# Patient Record
Sex: Male | Born: 1935
Health system: Southern US, Community
[De-identification: ages and names within clinical notes are randomized; demographics above are authoritative.]

## PROBLEM LIST (undated history)

## (undated) DIAGNOSIS — I10 Essential (primary) hypertension: Secondary | ICD-10-CM

## (undated) DIAGNOSIS — M1990 Unspecified osteoarthritis, unspecified site: Secondary | ICD-10-CM

## (undated) DIAGNOSIS — M719 Bursopathy, unspecified: Secondary | ICD-10-CM

## (undated) HISTORY — PX: CHOLECYSTECTOMY: SHX55

## (undated) HISTORY — PX: EYE SURGERY: SHX253

---

## 2010-11-09 ENCOUNTER — Emergency Department (INDEPENDENT_AMBULATORY_CARE_PROVIDER_SITE_OTHER): Payer: Medicare Other

## 2010-11-09 ENCOUNTER — Emergency Department (HOSPITAL_BASED_OUTPATIENT_CLINIC_OR_DEPARTMENT_OTHER)
Admission: EM | Admit: 2010-11-09 | Discharge: 2010-11-09 | Disposition: A | Discharge status: Home / Self Care | Payer: Medicare Other | Attending: Emergency Medicine | Admitting: Emergency Medicine

## 2010-11-09 DIAGNOSIS — D649 Anemia, unspecified: Secondary | ICD-10-CM | POA: Insufficient documentation

## 2010-11-09 DIAGNOSIS — R1032 Left lower quadrant pain: Secondary | ICD-10-CM

## 2010-11-09 DIAGNOSIS — I10 Essential (primary) hypertension: Secondary | ICD-10-CM | POA: Insufficient documentation

## 2010-11-09 DIAGNOSIS — R630 Anorexia: Secondary | ICD-10-CM | POA: Insufficient documentation

## 2010-11-09 DIAGNOSIS — E785 Hyperlipidemia, unspecified: Secondary | ICD-10-CM | POA: Insufficient documentation

## 2010-11-09 DIAGNOSIS — K5900 Constipation, unspecified: Secondary | ICD-10-CM | POA: Insufficient documentation

## 2010-11-09 DIAGNOSIS — R10814 Left lower quadrant abdominal tenderness: Secondary | ICD-10-CM | POA: Insufficient documentation

## 2010-11-09 DIAGNOSIS — K5732 Diverticulitis of large intestine without perforation or abscess without bleeding: Secondary | ICD-10-CM | POA: Insufficient documentation

## 2010-11-09 DIAGNOSIS — Z79899 Other long term (current) drug therapy: Secondary | ICD-10-CM | POA: Insufficient documentation

## 2010-11-09 DIAGNOSIS — R000 Tachycardia, unspecified: Secondary | ICD-10-CM | POA: Insufficient documentation

## 2010-11-09 LAB — CBC
HCT: 35.4 % — ABNORMAL LOW (ref 39.0–52.0)
Hemoglobin: 11.9 g/dL — ABNORMAL LOW (ref 13.0–17.0)
MCV: 90.3 fL (ref 78.0–100.0)
RBC: 3.92 MIL/uL — ABNORMAL LOW (ref 4.22–5.81)
WBC: 11.1 10*3/uL — ABNORMAL HIGH (ref 4.0–10.5)

## 2010-11-09 LAB — URINE MICROSCOPIC-ADD ON

## 2010-11-09 LAB — BASIC METABOLIC PANEL
BUN: 18 mg/dL (ref 6–23)
CO2: 24 mEq/L (ref 19–32)
Chloride: 106 mEq/L (ref 96–112)
GFR calc non Af Amer: >60 mL/min (ref >60)
Glucose, Bld: 110 mg/dL — ABNORMAL HIGH (ref 70–99)
Potassium: 4.2 mEq/L (ref 3.5–5.1)
Sodium: 141 mEq/L (ref 135–145)

## 2010-11-09 LAB — DIFFERENTIAL
Basophils Absolute: 0 10*3/uL (ref 0.0–0.1)
Eosinophils Relative: 2 % (ref 0–5)
Lymphocytes Relative: 13 % (ref 12–46)
Lymphs Abs: 1.5 10*3/uL (ref 0.7–4.0)
Neutro Abs: 8 10*3/uL — ABNORMAL HIGH (ref 1.7–7.7)
Neutrophils Relative %: 72 % (ref 43–77)

## 2010-11-09 LAB — URINALYSIS, ROUTINE W REFLEX MICROSCOPIC
Bilirubin Urine: NEGATIVE
Ketones, ur: NEGATIVE mg/dL
Nitrite: NEGATIVE
Specific Gravity, Urine: 1.025 (ref 1.005–1.030)
Urine Glucose, Fasting: NEGATIVE mg/dL
pH: 7 (ref 5.0–8.0)

## 2010-11-09 MED ORDER — IOHEXOL 300 MG/ML  SOLN
100.0000 mL | Freq: Once | INTRAMUSCULAR | Status: AC | PRN
  Administered 2010-11-09: 100 mL via INTRAVENOUS

## 2013-04-02 ENCOUNTER — Emergency Department (HOSPITAL_BASED_OUTPATIENT_CLINIC_OR_DEPARTMENT_OTHER): Payer: Medicare Other

## 2013-04-02 ENCOUNTER — Encounter (HOSPITAL_BASED_OUTPATIENT_CLINIC_OR_DEPARTMENT_OTHER): Payer: Self-pay | Admitting: *Deleted

## 2013-04-02 DIAGNOSIS — E119 Type 2 diabetes mellitus without complications: Secondary | ICD-10-CM | POA: Insufficient documentation

## 2013-04-02 DIAGNOSIS — Y929 Unspecified place or not applicable: Secondary | ICD-10-CM | POA: Insufficient documentation

## 2013-04-02 DIAGNOSIS — Z87891 Personal history of nicotine dependence: Secondary | ICD-10-CM | POA: Insufficient documentation

## 2013-04-02 DIAGNOSIS — Z79899 Other long term (current) drug therapy: Secondary | ICD-10-CM | POA: Insufficient documentation

## 2013-04-02 DIAGNOSIS — S62113A Displaced fracture of triquetrum [cuneiform] bone, unspecified wrist, initial encounter for closed fracture: Secondary | ICD-10-CM | POA: Insufficient documentation

## 2013-04-02 DIAGNOSIS — W219XXA Striking against or struck by unspecified sports equipment, initial encounter: Secondary | ICD-10-CM | POA: Insufficient documentation

## 2013-04-02 DIAGNOSIS — I10 Essential (primary) hypertension: Secondary | ICD-10-CM | POA: Insufficient documentation

## 2013-04-02 DIAGNOSIS — Z7982 Long term (current) use of aspirin: Secondary | ICD-10-CM | POA: Insufficient documentation

## 2013-04-02 DIAGNOSIS — Z8739 Personal history of other diseases of the musculoskeletal system and connective tissue: Secondary | ICD-10-CM | POA: Insufficient documentation

## 2013-04-02 DIAGNOSIS — Y9389 Activity, other specified: Secondary | ICD-10-CM | POA: Insufficient documentation

## 2013-04-02 NOTE — ED Notes (Signed)
Pt states he injured his left wrist playing racquetball earlier today. PMS intact. Ice and splint applied.

## 2013-04-03 ENCOUNTER — Emergency Department (HOSPITAL_BASED_OUTPATIENT_CLINIC_OR_DEPARTMENT_OTHER)
Admission: EM | Admit: 2013-04-03 | Discharge: 2013-04-03 | Disposition: A | Discharge status: Home / Self Care | Payer: Medicare Other | Attending: Emergency Medicine | Admitting: Emergency Medicine

## 2013-04-03 DIAGNOSIS — S62112A Displaced fracture of triquetrum [cuneiform] bone, left wrist, initial encounter for closed fracture: Secondary | ICD-10-CM

## 2013-04-03 HISTORY — DX: Unspecified osteoarthritis, unspecified site: M19.90

## 2013-04-03 HISTORY — DX: Essential (primary) hypertension: I10

## 2013-04-03 HISTORY — DX: Bursopathy, unspecified: M71.9

## 2013-04-03 NOTE — ED Provider Notes (Signed)
History     This chart was scribed for Timothy Seamen, MD by Jiles Prows, ED Scribe. The patient was seen in room MH05/MH05 and the patient's care was started at 12:46 AM.   CSN: [id number] Arrival date & time 04/02/13  2232    Chief Complaint  Patient presents with  . Wrist Injury    The history is provided by the patient and medical records. No language interpreter was used.   HPI Comments: Timothy Maynard is a 79 y.o. male who presents to the Emergency Department complaining of moderate pain in the left anterior forearm.  Pt reports he joined the [employer] a week ago.  He states that he was playing racketball tonight when he hit his left arm on the wall.  He claims exacerbated pain with pressure or movement.  He reports decreased ability to move fingers of left hand.  Pt with dominant right hand.  Pt denies headache, diaphoresis, fever, chills, nausea, vomiting, diarrhea, weakness, cough, SOB and any other pain.   Past Medical History  Diagnosis Date  . Hypertension   . Diabetes mellitus without complication   . Bursitis   . Arthritis    Past Surgical History  Procedure Laterality Date  . Eye surgery    . Cholecystectomy     History reviewed. No pertinent family history. History  Substance Use Topics  . Smoking status: Former Games developer  . Smokeless tobacco: Not on file  . Alcohol Use: No    Review of Systems  All other systems reviewed and are negative.    Allergies  Review of patient's allergies indicates no known allergies.  Home Medications   Current Outpatient Rx  Name  Route  Sig  Dispense  Refill  . aspirin 81 MG tablet   Oral   Take 81 mg by mouth daily.         Marland Kitchen diltiazem (DILACOR XR) 240 MG 24 hr capsule   Oral   Take 240 mg by mouth daily.         . hydrochlorothiazide (HYDRODIURIL) 25 MG tablet   Oral   Take 12.5 mg by mouth daily.         Marland Kitchen losartan (COZAAR) 100 MG tablet   Oral   Take 100 mg by mouth daily.         . Multiple  Vitamins-Minerals (ICAPS LUTEIN & OMEGA-3) CAPS   Oral   Take by mouth.         . polycarbophil (FIBERCON) 625 MG tablet   Oral   Take 625 mg by mouth daily.         . pravastatin (PRAVACHOL) 40 MG tablet   Oral   Take 40 mg by mouth daily.          BP 152/76  Pulse 87  Temp(Src) 97.7 F (36.5 C) (Oral)  SpO2 96% Physical Exam  Nursing note and vitals reviewed. Constitutional: He is oriented to person, place, and time. He appears well-developed and well-nourished. No distress.  HENT:  Head: Normocephalic and atraumatic.  Right Ear: External ear normal.  Left Ear: External ear normal.  Eyes: Conjunctivae and EOM are normal. Pupils are equal, round, and reactive to light.  Arcus similis bilaterally.  Neck: Normal range of motion. Neck supple. No tracheal deviation present.  Cardiovascular: Normal rate, regular rhythm and normal heart sounds.  Exam reveals no gallop and no friction rub.   No murmur heard. Pulmonary/Chest: Effort normal and breath sounds normal. No respiratory distress. He  has no wheezes. He has no rales. He exhibits no tenderness.  Abdominal: Soft. He exhibits no distension. There is no tenderness. There is no rebound and no guarding.  Musculoskeletal: Normal range of motion. He exhibits tenderness.  Tenderness of volar aspect of left wrist over carpal bones.  Decreased ROM of left wrist particularly extension.  Left hand distally neurovascularly intact with tendon function.  Neurological: He is alert and oriented to person, place, and time.  Skin: Skin is warm and dry.  Psychiatric: He has a normal mood and affect. His behavior is normal.   ED Course  Procedures (including critical care time) DIAGNOSTIC STUDIES: Oxygen Saturation is 96% on RA, adequate by my interpretation.    COORDINATION OF CARE: 12:49 AM - Discussed ED treatment with pt at bedside including pain management and follow up with orthopedist and pt agrees.   Labs Reviewed - No data to  display Dg Wrist Complete Left  04/03/2013   *RADIOLOGY REPORT*  Clinical Data: Left wrist injury playing racquetball.  Increased pain with movement.  LEFT WRIST - COMPLETE 3+ VIEW  Comparison: None.  Findings: Degenerative changes in the STT joints.  Circumscribed lucent structure in the distal scaphoid likely represents a degenerative cyst or benign bone cyst.  Focal cortical cleft demonstrated over the dorsal aspect of the triquetral bone with overlying soft tissue swelling.  Nondisplaced triquetral fracture is not excluded.  Correlation with site of patient's pain is recommended.  Carpal bones appear otherwise intact. No radiopaque soft tissue foreign bodies.  IMPRESSION: Focal cleft in the dorsal aspect the triquetral bone with overlying soft tissue swelling could represent nondisplaced fracture. Degenerative changes in the STT joints with probable degenerative cyst in the scaphoid.   Original Report Authenticated By: Burman Nieves, M.D.   1. Fracture of triquetrum of left wrist, closed, initial encounter     MDM  I personally performed the services described in this documentation, which was scribed in my presence.  The recorded information has been reviewed and is accurate.    Timothy Seamen, MD 04/03/13 [phone]

## 2013-05-15 ENCOUNTER — Encounter (HOSPITAL_BASED_OUTPATIENT_CLINIC_OR_DEPARTMENT_OTHER): Payer: Self-pay | Admitting: *Deleted

## 2013-05-15 ENCOUNTER — Emergency Department (HOSPITAL_BASED_OUTPATIENT_CLINIC_OR_DEPARTMENT_OTHER)
Admission: EM | Admit: 2013-05-15 | Discharge: 2013-05-15 | Disposition: A | Discharge status: Home / Self Care | Payer: Medicare Other | Attending: Emergency Medicine | Admitting: Emergency Medicine

## 2013-05-15 ENCOUNTER — Emergency Department (HOSPITAL_BASED_OUTPATIENT_CLINIC_OR_DEPARTMENT_OTHER): Payer: Medicare Other

## 2013-05-15 DIAGNOSIS — Z8739 Personal history of other diseases of the musculoskeletal system and connective tissue: Secondary | ICD-10-CM | POA: Insufficient documentation

## 2013-05-15 DIAGNOSIS — Z87891 Personal history of nicotine dependence: Secondary | ICD-10-CM | POA: Insufficient documentation

## 2013-05-15 DIAGNOSIS — Z79899 Other long term (current) drug therapy: Secondary | ICD-10-CM | POA: Insufficient documentation

## 2013-05-15 DIAGNOSIS — Z7982 Long term (current) use of aspirin: Secondary | ICD-10-CM | POA: Insufficient documentation

## 2013-05-15 DIAGNOSIS — R0602 Shortness of breath: Secondary | ICD-10-CM | POA: Insufficient documentation

## 2013-05-15 DIAGNOSIS — I10 Essential (primary) hypertension: Secondary | ICD-10-CM | POA: Insufficient documentation

## 2013-05-15 DIAGNOSIS — M129 Arthropathy, unspecified: Secondary | ICD-10-CM | POA: Insufficient documentation

## 2013-05-15 DIAGNOSIS — R059 Cough, unspecified: Secondary | ICD-10-CM | POA: Insufficient documentation

## 2013-05-15 DIAGNOSIS — R05 Cough: Secondary | ICD-10-CM | POA: Insufficient documentation

## 2013-05-15 DIAGNOSIS — E119 Type 2 diabetes mellitus without complications: Secondary | ICD-10-CM | POA: Insufficient documentation

## 2013-05-15 DIAGNOSIS — J690 Pneumonitis due to inhalation of food and vomit: Secondary | ICD-10-CM

## 2013-05-15 DIAGNOSIS — R062 Wheezing: Secondary | ICD-10-CM | POA: Insufficient documentation

## 2013-05-15 DIAGNOSIS — J069 Acute upper respiratory infection, unspecified: Secondary | ICD-10-CM | POA: Insufficient documentation

## 2013-05-15 DIAGNOSIS — R0989 Other specified symptoms and signs involving the circulatory and respiratory systems: Secondary | ICD-10-CM | POA: Insufficient documentation

## 2013-05-15 MED ORDER — ALBUTEROL SULFATE HFA 108 (90 BASE) MCG/ACT IN AERS
2.0000 | INHALATION_SPRAY | Freq: Once | RESPIRATORY_TRACT | Status: AC
  Administered 2013-05-15: 2 via RESPIRATORY_TRACT

## 2013-05-15 MED ORDER — ALBUTEROL SULFATE (5 MG/ML) 0.5% IN NEBU
INHALATION_SOLUTION | RESPIRATORY_TRACT | Status: AC
  Filled 2013-05-15: qty 1

## 2013-05-15 MED ORDER — ALBUTEROL SULFATE (5 MG/ML) 0.5% IN NEBU
5.0000 mg | INHALATION_SOLUTION | Freq: Once | RESPIRATORY_TRACT | Status: AC
  Administered 2013-05-15: 5 mg via RESPIRATORY_TRACT

## 2013-05-15 MED ORDER — IPRATROPIUM BROMIDE 0.02 % IN SOLN
RESPIRATORY_TRACT | Status: AC
  Filled 2013-05-15: qty 2.5

## 2013-05-15 MED ORDER — ALBUTEROL SULFATE (5 MG/ML) 0.5% IN NEBU: 2.5000 mg | INHALATION_SOLUTION | RESPIRATORY_TRACT | Status: DC

## 2013-05-15 MED ORDER — LEVOFLOXACIN 750 MG PO TABS: 750.0000 mg | ORAL_TABLET | Freq: Once | ORAL | Status: DC

## 2013-05-15 MED ORDER — IPRATROPIUM BROMIDE 0.02 % IN SOLN
0.5000 mg | Freq: Once | RESPIRATORY_TRACT | Status: AC
  Administered 2013-05-15: 0.5 mg via RESPIRATORY_TRACT

## 2013-05-15 MED ORDER — IPRATROPIUM BROMIDE 0.02 % IN SOLN: 0.5000 mg | RESPIRATORY_TRACT | Status: DC

## 2013-05-15 MED ORDER — CLINDAMYCIN HCL 150 MG PO CAPS
450.0000 mg | ORAL_CAPSULE | Freq: Once | ORAL | Status: AC
  Administered 2013-05-15: 450 mg via ORAL
  Filled 2013-05-15: qty 3

## 2013-05-15 MED ORDER — ALBUTEROL SULFATE HFA 108 (90 BASE) MCG/ACT IN AERS
INHALATION_SPRAY | RESPIRATORY_TRACT | Status: AC
  Filled 2013-05-15: qty 6.7

## 2013-05-15 MED ORDER — LEVOFLOXACIN 750 MG PO TABS
750.0000 mg | ORAL_TABLET | Freq: Once | ORAL | Status: AC
  Administered 2013-05-15: 750 mg via ORAL
  Filled 2013-05-15: qty 1

## 2013-05-15 MED ORDER — CLINDAMYCIN HCL 150 MG PO CAPS: 450.0000 mg | ORAL_CAPSULE | Freq: Three times a day (TID) | ORAL | Status: DC

## 2013-05-15 NOTE — ED Notes (Signed)
Patient woke up last night vomiting. patient thinks that he aspirated when he vomited.  Patient thinks that is the reason why he has been coughing.  Patient denies any nausea or vomiting.

## 2013-05-15 NOTE — ED Notes (Signed)
Pt woke up and vomited a small amount and things he "inhaled", coughing intermittently since, states feels it in chest, was also power washing yesterday and sprayed bug spray

## 2013-05-15 NOTE — ED Provider Notes (Signed)
CSN: [id number]     Arrival date & time 05/15/13  1318 History     First MD Initiated Contact with Patient 05/15/13 1331     No chief complaint on file.  (Consider location/radiation/quality/duration/timing/severity/associated sxs/prior Treatment) Patient is a 79 y.o. male presenting with vomiting. The history is provided by the patient.  Emesis Severity:  Mild Timing: once. Number of daily episodes:  One Able to tolerate:  Liquids Progression:  Resolved Chronicity:  New Recent urination:  Normal Relieved by:  Nothing Worsened by:  Nothing tried Ineffective treatments:  None tried Associated symptoms: cough (mild) and URI (recently treated for chest congestion with azithromycin)   Associated symptoms: no abdominal pain, no chills, no diarrhea, no fever, no headaches, no myalgias and no sore throat   Risk factors comment:  Used bug spray yesterday, concerned he might've inhaled a small amount   Past Medical History  Diagnosis Date  . Hypertension   . Diabetes mellitus without complication   . Bursitis   . Arthritis    Past Surgical History  Procedure Laterality Date  . Eye surgery    . Cholecystectomy     History reviewed. No pertinent family history. History  Substance Use Topics  . Smoking status: Former Games developer  . Smokeless tobacco: Not on file  . Alcohol Use: No    Review of Systems  Constitutional: Negative for fever and chills.  HENT: Negative for sore throat.   Respiratory: Positive for cough and shortness of breath.   Gastrointestinal: Positive for vomiting (once, overnight). Negative for abdominal pain and diarrhea.  Musculoskeletal: Negative for myalgias.  Neurological: Negative for headaches.  All other systems reviewed and are negative.    Allergies  Review of patient's allergies indicates no known allergies.  Home Medications   Current Outpatient Rx  Name  Route  Sig  Dispense  Refill  . aspirin 81 MG tablet   Oral   Take 81 mg by mouth  daily.         Marland Kitchen diltiazem (DILACOR XR) 240 MG 24 hr capsule   Oral   Take 240 mg by mouth daily.         . hydrochlorothiazide (HYDRODIURIL) 25 MG tablet   Oral   Take 12.5 mg by mouth daily.         Marland Kitchen losartan (COZAAR) 100 MG tablet   Oral   Take 100 mg by mouth daily.         . Multiple Vitamins-Minerals (ICAPS LUTEIN & OMEGA-3) CAPS   Oral   Take by mouth.         . polycarbophil (FIBERCON) 625 MG tablet   Oral   Take 625 mg by mouth daily.         . pravastatin (PRAVACHOL) 40 MG tablet   Oral   Take 40 mg by mouth daily.          BP 129/58  Pulse 95  Temp(Src) 97.6 F (36.4 C) (Oral)  Resp 20  Ht 5\' 9"  (1.753 m)  Wt 200 lb (90.719 kg)  BMI 29.52 kg/m2  SpO2 94% Physical Exam  Constitutional: He is oriented to person, place, and time. He appears well-developed and well-nourished. No distress.  HENT:  Head: Normocephalic and atraumatic.  Mouth/Throat: No oropharyngeal exudate.  Eyes: EOM are normal. Pupils are equal, round, and reactive to light.  Neck: Normal range of motion. Neck supple.  Cardiovascular: Normal rate and regular rhythm.  Exam reveals no friction rub.   No  murmur heard. Pulmonary/Chest: Effort normal. No respiratory distress. He has wheezes (end expiratory, diffuse). He has no rales.  Abdominal: He exhibits no distension. There is no tenderness. There is no rebound.  Musculoskeletal: Normal range of motion. He exhibits no edema.  Neurological: He is alert and oriented to person, place, and time.  Skin: He is not diaphoretic.    ED Course   Procedures (including critical care time)  Labs Reviewed - No data to display Dg Chest 2 View  05/15/2013   *RADIOLOGY REPORT*  Clinical Data: Shortness of breath, vomiting last night question aspiration, history hypertension, diabetes  CHEST - 2 VIEW  Comparison: None  Findings: Normal heart size and pulmonary vascularity. Tortuous aorta. Right middle lobe infiltrate which could represent  pneumonia or aspiration. Minimal left basilar atelectasis. Remaining lungs clear. No pleural effusion or pneumothorax. Bones unremarkable.  IMPRESSION: Right middle lobe infiltrate question pneumonia versus aspiration. Minimal left base atelectasis.   Original Report Authenticated By: Ulyses Southward, M.D.   1. Aspiration pneumonia     MDM  79 year old male comes in with shortness of breath and coughing. Patient recently finished a course of azithromycin for some chest congestion about 5-6 days ago. He reports that overnight he vomited and feels like he inhaled some into the lungs used and coughing intermittently since then. One episode of productive cough of with yellow sputum, all other coughing has been with clear sputum. Patient also concerned because he was bug spraying yesterday and he might inhaled he was spraying in house. He denies any fevers, vomiting after that first episode, abdominal pain, shortness of breath at this time, hemoptysis. He denies any chills or other systemic symptoms. On exam his vitals are stable. No hypoxia. No tachypnea or respiratory distress. He has some mild expiratory wheezes in all lung fields. Will give duo neb. Will obtain chest x-ray. Chest x-ray shows a right middle lobe pneumonia. Also concern for possible aspiration. Will treat with Levaquin and Clindamycin. I do not feel labs are warranted with stable vitals and good appearance, patient does not want admission. Patient would like to try outpatient therapy and is amenable to this plan. Encouraged very quick and prompt PCP followup. He states he can see his doctor Monday or Tuesday. Given strict return precautions.   Dagmar Hait, MD 05/15/13 1440

## 2013-12-19 ENCOUNTER — Emergency Department (HOSPITAL_BASED_OUTPATIENT_CLINIC_OR_DEPARTMENT_OTHER): Payer: Medicare Other

## 2013-12-19 ENCOUNTER — Emergency Department (HOSPITAL_BASED_OUTPATIENT_CLINIC_OR_DEPARTMENT_OTHER)
Admission: EM | Admit: 2013-12-19 | Discharge: 2013-12-19 | Disposition: A | Discharge status: Home / Self Care | Payer: Medicare Other | Attending: Emergency Medicine | Admitting: Emergency Medicine

## 2013-12-19 ENCOUNTER — Encounter (HOSPITAL_BASED_OUTPATIENT_CLINIC_OR_DEPARTMENT_OTHER): Payer: Self-pay | Admitting: Emergency Medicine

## 2013-12-19 DIAGNOSIS — Z79899 Other long term (current) drug therapy: Secondary | ICD-10-CM | POA: Insufficient documentation

## 2013-12-19 DIAGNOSIS — R0602 Shortness of breath: Secondary | ICD-10-CM

## 2013-12-19 DIAGNOSIS — R05 Cough: Secondary | ICD-10-CM | POA: Insufficient documentation

## 2013-12-19 DIAGNOSIS — I10 Essential (primary) hypertension: Secondary | ICD-10-CM | POA: Insufficient documentation

## 2013-12-19 DIAGNOSIS — Z7982 Long term (current) use of aspirin: Secondary | ICD-10-CM | POA: Insufficient documentation

## 2013-12-19 DIAGNOSIS — Z87891 Personal history of nicotine dependence: Secondary | ICD-10-CM | POA: Insufficient documentation

## 2013-12-19 DIAGNOSIS — Z8739 Personal history of other diseases of the musculoskeletal system and connective tissue: Secondary | ICD-10-CM | POA: Insufficient documentation

## 2013-12-19 DIAGNOSIS — M129 Arthropathy, unspecified: Secondary | ICD-10-CM | POA: Insufficient documentation

## 2013-12-19 DIAGNOSIS — R059 Cough, unspecified: Secondary | ICD-10-CM | POA: Insufficient documentation

## 2013-12-19 LAB — COMPREHENSIVE METABOLIC PANEL
ALK PHOS: 108 U/L (ref 39–117)
ALT: 40 U/L (ref 0–53)
AST: 28 U/L (ref 0–37)
Albumin: 3.6 g/dL (ref 3.5–5.2)
BILIRUBIN TOTAL: 0.3 mg/dL (ref 0.3–1.2)
BUN: 21 mg/dL (ref 6–23)
CHLORIDE: 106 meq/L (ref 96–112)
CO2: 26 meq/L (ref 19–32)
CREATININE: 1 mg/dL (ref 0.50–1.35)
Calcium: 9.5 mg/dL (ref 8.4–10.5)
GFR calc Af Amer: 82 mL/min — ABNORMAL LOW (ref >90)
GFR, EST NON AFRICAN AMERICAN: 70 mL/min — AB (ref >90)
Glucose, Bld: 101 mg/dL — ABNORMAL HIGH (ref 70–99)
Potassium: 4.3 mEq/L (ref 3.7–5.3)
Sodium: 144 mEq/L (ref 137–147)
Total Protein: 6.8 g/dL (ref 6.0–8.3)

## 2013-12-19 LAB — CBC WITH DIFFERENTIAL/PLATELET
BASOS ABS: 0.1 10*3/uL (ref 0.0–0.1)
BASOS PCT: 1 % (ref 0–1)
EOS ABS: 0.2 10*3/uL (ref 0.0–0.7)
Eosinophils Relative: 4 % (ref 0–5)
HCT: 37.2 % — ABNORMAL LOW (ref 39.0–52.0)
Hemoglobin: 12.6 g/dL — ABNORMAL LOW (ref 13.0–17.0)
Lymphocytes Relative: 29 % (ref 12–46)
Lymphs Abs: 1.2 10*3/uL (ref 0.7–4.0)
MCH: 31.8 pg (ref 26.0–34.0)
MCHC: 33.9 g/dL (ref 30.0–36.0)
MCV: 93.9 fL (ref 78.0–100.0)
Monocytes Absolute: 0.5 10*3/uL (ref 0.1–1.0)
Monocytes Relative: 12 % (ref 3–12)
NEUTROS PCT: 55 % (ref 43–77)
Neutro Abs: 2.4 10*3/uL (ref 1.7–7.7)
PLATELETS: 160 10*3/uL (ref 150–400)
RBC: 3.96 MIL/uL — AB (ref 4.22–5.81)
RDW: 14.4 % (ref 11.5–15.5)
WBC: 4.3 10*3/uL (ref 4.0–10.5)

## 2013-12-19 LAB — PRO B NATRIURETIC PEPTIDE: PRO B NATRI PEPTIDE: 115.7 pg/mL (ref 0–450)

## 2013-12-19 LAB — TROPONIN I

## 2013-12-19 NOTE — ED Provider Notes (Addendum)
CSN: [id number]     Arrival date & time 12/19/13  0902 History   First MD Initiated Contact with Patient 12/19/13 [phone]     Chief Complaint  Patient presents with  . Shortness of Breath     (Consider location/radiation/quality/duration/timing/severity/associated sxs/prior Treatment) The history is provided by the patient.   80 year old male with known history of shortness of breath for the past several months. No specific diagnosis. Was a former smoker so COPD or emphysema is a possibility. Patient denies any chest pain. Last 2 weeks some exertional shortness of breath has gotten a little bit worse. However patient has been able to recently loaded pickup truck full of a Cotton[place] after treatment went down without any significant problems. Patient denies any chest pain any back pain no real congestion no significant cough. Patient is followed by internal medicine or stone.  Past Medical History  Diagnosis Date  . Hypertension   . Bursitis   . Arthritis    Past Surgical History  Procedure Laterality Date  . Eye surgery    . Cholecystectomy     History reviewed. No pertinent family history. History  Substance Use Topics  . Smoking status: Former Research scientist (life sciences)  . Smokeless tobacco: Not on file  . Alcohol Use: No    Review of Systems  Constitutional: Negative for fever and fatigue.  HENT: Negative for congestion.   Respiratory: Positive for cough and shortness of breath.   Cardiovascular: Negative for chest pain.  Gastrointestinal: Negative for nausea, vomiting and abdominal pain.  Genitourinary: Negative for dysuria.  Musculoskeletal: Negative for back pain.  Skin: Negative for rash.  Neurological: Negative for headaches.  Hematological: Does not bruise/bleed easily.  Psychiatric/Behavioral: Negative for confusion.      Allergies  Ciprofloxacin; Cyclobenzaprine; and Levaquin  Home Medications   Current Outpatient Rx  Name  Route  Sig  Dispense  Refill  . aspirin 81 MG  tablet   Oral   Take 81 mg by mouth daily.         Marland Kitchen diltiazem (DILACOR XR) 240 MG 24 hr capsule   Oral   Take 240 mg by mouth daily.         . hydrochlorothiazide (HYDRODIURIL) 25 MG tablet   Oral   Take 12.5 mg by mouth daily.         Marland Kitchen losartan (COZAAR) 100 MG tablet   Oral   Take 100 mg by mouth daily.         . Multiple Vitamins-Minerals (ICAPS LUTEIN & OMEGA-3) CAPS   Oral   Take by mouth.         . polycarbophil (FIBERCON) 625 MG tablet   Oral   Take 625 mg by mouth daily.         . pravastatin (PRAVACHOL) 40 MG tablet   Oral   Take 40 mg by mouth daily.          BP 122/71  Pulse 86  Temp(Src) 98.1 F (36.7 C) (Oral)  Resp 16  Ht 5\' 8"  (1.727 m)  Wt 200 lb (90.719 kg)  BMI 30.42 kg/m2  SpO2 98% Physical Exam  Nursing note and vitals reviewed. Constitutional: He is oriented to person, place, and time. He appears well-developed and well-nourished. No distress.  HENT:  Head: Normocephalic and atraumatic.  Mouth/Throat: Oropharynx is clear and moist.  Eyes: Conjunctivae and EOM are normal. Pupils are equal, round, and reactive to light.  Neck: Normal range of motion.  Cardiovascular: Normal rate, regular rhythm  and normal heart sounds.   No murmur heard. Pulmonary/Chest: Effort normal and breath sounds normal. No respiratory distress.  Abdominal: Soft. Bowel sounds are normal. There is no tenderness.  Musculoskeletal: Normal range of motion. He exhibits no edema.  Neurological: He is alert and oriented to person, place, and time. No cranial nerve deficit. He exhibits normal muscle tone. Coordination normal.  Skin: Skin is warm. No rash noted.    ED Course  Procedures (including critical care time) Labs Review Labs Reviewed  COMPREHENSIVE METABOLIC PANEL - Abnormal; Notable for the following:    Glucose, Bld 101 (*)    GFR calc non Af Amer 70 (*)    GFR calc Af Amer 82 (*)    All other components within normal limits  CBC WITH  DIFFERENTIAL - Abnormal; Notable for the following:    RBC 3.96 (*)    Hemoglobin 12.6 (*)    HCT 37.2 (*)    All other components within normal limits  TROPONIN I  PRO B NATRIURETIC PEPTIDE   Results for orders placed during the hospital encounter of 12/19/13  TROPONIN I      Result Value Ref Range   Troponin I <0.30  <0.30 ng/mL  COMPREHENSIVE METABOLIC PANEL      Result Value Ref Range   Sodium 144  137 - 147 mEq/L   Potassium 4.3  3.7 - 5.3 mEq/L   Chloride 106  96 - 112 mEq/L   CO2 26  19 - 32 mEq/L   Glucose, Bld 101 (*) 70 - 99 mg/dL   BUN 21  6 - 23 mg/dL   Creatinine, Ser 1.00  0.50 - 1.35 mg/dL   Calcium 9.5  8.4 - 10.5 mg/dL   Total Protein 6.8  6.0 - 8.3 g/dL   Albumin 3.6  3.5 - 5.2 g/dL   AST 28  0 - 37 U/L   ALT 40  0 - 53 U/L   Alkaline Phosphatase 108  39 - 117 U/L   Total Bilirubin 0.3  0.3 - 1.2 mg/dL   GFR calc non Af Amer 70 (*) >90 mL/min   GFR calc Af Amer 82 (*) >90 mL/min  CBC WITH DIFFERENTIAL      Result Value Ref Range   WBC 4.3  4.0 - 10.5 K/uL   RBC 3.96 (*) 4.22 - 5.81 MIL/uL   Hemoglobin 12.6 (*) 13.0 - 17.0 g/dL   HCT 37.2 (*) 39.0 - 52.0 %   MCV 93.9  78.0 - 100.0 fL   MCH 31.8  26.0 - 34.0 pg   MCHC 33.9  30.0 - 36.0 g/dL   RDW 14.4  11.5 - 15.5 %   Platelets 160  150 - 400 K/uL   Neutrophils Relative % 55  43 - 77 %   Neutro Abs 2.4  1.7 - 7.7 K/uL   Lymphocytes Relative 29  12 - 46 %   Lymphs Abs 1.2  0.7 - 4.0 K/uL   Monocytes Relative 12  3 - 12 %   Monocytes Absolute 0.5  0.1 - 1.0 K/uL   Eosinophils Relative 4  0 - 5 %   Eosinophils Absolute 0.2  0.0 - 0.7 K/uL   Basophils Relative 1  0 - 1 %   Basophils Absolute 0.1  0.0 - 0.1 K/uL  PRO B NATRIURETIC PEPTIDE      Result Value Ref Range   Pro B Natriuretic peptide (BNP) 115.7  0 - 450 pg/mL    Imaging  Review Dg Chest 2 View  12/19/2013   CLINICAL DATA:  Shortness of breath.  EXAM: CHEST  2 VIEW  COMPARISON:  PA and lateral chest 10/31/2013 and 06/06/2013.  FINDINGS:  The lungs are clear. Heart size is normal. There is no pneumothorax or pleural effusion. No focal bony abnormality is identified.  IMPRESSION: Negative exam.   Electronically Signed   By: Inge Rise M.D.   On: 12/19/2013 10:17     EKG Interpretation   Date/Time:  Monday December 19 2013 09:15:53 EDT Ventricular Rate:  94 PR Interval:  166 QRS Duration: 102 QT Interval:  356 QTC Calculation: 445 R Axis:   48 Text Interpretation:  Normal sinus rhythm Incomplete right bundle branch  block Borderline ECG No previous ECGs available Confirmed by Dymphna Wadley   MD, Delailah Spieth [phone]) on 12/19/2013 10:36:35 AM      Date: 12/19/2013  Rate: 94  Rhythm: normal sinus rhythm  QRS Axis: normal  Intervals: normal  ST/T Wave abnormalities: normal  Conduction Disutrbances:right bundle branch block  Narrative Interpretation:   Old EKG Reviewed: none available  And consistent with an incomplete right bundle branch block. EKG did not crossover in MUSE.  MDM   Final diagnoses:  Shortness of breath   Patient with long-standing exertional shortness of breath just a little bit worse in the last week or so. Not associated with any chest pain. Today's workup negative EKG without acute changes. Troponin negative no evidence of an acute cardiac event. No significant elevation in BNP. Chest x-ray negative for pneumonia pneumothorax or pulmonary edema. No significant anemia noted significant electrolyte abnormalities renal function is normal. Patient's symptoms been ongoing for a good period of time clinically not concerned about pulmonary embolus. Patient does have primary care Dr. to followup with.    Mervin Kung, MD 12/19/13 [place], MD 12/19/13 [phone]

## 2013-12-19 NOTE — Discharge Instructions (Signed)
Shortness of Breath Shortness of breath means you have trouble breathing. Shortness of breath needs medical care right away. HOME CARE   Do not smoke.  Avoid being around chemicals or things (paint fumes, dust) that may bother your breathing.  Rest as needed. Slowly begin your normal activities.  Only take medicines as told by your doctor.  Keep all doctor visits as told. GET HELP RIGHT AWAY IF:   Your shortness of breath gets worse.  You feel lightheaded, pass out (faint), or have a cough that is not helped by medicine.  You cough up blood.  You have pain with breathing.  You have pain in your chest, arms, shoulders, or belly (abdomen).  You have a fever.  You cannot walk up stairs or exercise the way you normally do.  You do not get better in the time expected.  You have a hard time doing normal activities even with rest.  You have problems with your medicines.  You have any new symptoms. MAKE SURE YOU:  Understand these instructions.  Will watch your condition.  Will get help right away if you are not doing well or get worse. Document Released: 02/25/2008 Document Revised: 03/09/2012 Document Reviewed: 11/24/2011 [facility] Patient Information 2014 [place], [place].  Return for any newer worse symptoms. Otherwise followup with your regular Dr. sometime in the next few weeks. Today's workup without any significant findings.

## 2013-12-19 NOTE — ED Notes (Signed)
Pt reports SOB x several months.  Pt to leave to go out of town today and wanted to be checked out prior to leaving.  Denies pain, reports worsening SOB with exertion.

## 2014-07-23 ENCOUNTER — Encounter (HOSPITAL_BASED_OUTPATIENT_CLINIC_OR_DEPARTMENT_OTHER): Payer: Self-pay | Admitting: Emergency Medicine

## 2014-07-23 ENCOUNTER — Emergency Department (HOSPITAL_BASED_OUTPATIENT_CLINIC_OR_DEPARTMENT_OTHER)
Admission: EM | Admit: 2014-07-23 | Discharge: 2014-07-23 | Disposition: A | Discharge status: Home / Self Care | Payer: Medicare Other | Attending: Emergency Medicine | Admitting: Emergency Medicine

## 2014-07-23 ENCOUNTER — Emergency Department (HOSPITAL_BASED_OUTPATIENT_CLINIC_OR_DEPARTMENT_OTHER): Payer: Medicare Other

## 2014-07-23 DIAGNOSIS — Z79899 Other long term (current) drug therapy: Secondary | ICD-10-CM | POA: Insufficient documentation

## 2014-07-23 DIAGNOSIS — Z87891 Personal history of nicotine dependence: Secondary | ICD-10-CM | POA: Diagnosis not present

## 2014-07-23 DIAGNOSIS — I10 Essential (primary) hypertension: Secondary | ICD-10-CM | POA: Insufficient documentation

## 2014-07-23 DIAGNOSIS — Z9049 Acquired absence of other specified parts of digestive tract: Secondary | ICD-10-CM | POA: Diagnosis not present

## 2014-07-23 DIAGNOSIS — M1990 Unspecified osteoarthritis, unspecified site: Secondary | ICD-10-CM | POA: Insufficient documentation

## 2014-07-23 DIAGNOSIS — Z87442 Personal history of urinary calculi: Secondary | ICD-10-CM | POA: Diagnosis not present

## 2014-07-23 DIAGNOSIS — Z7982 Long term (current) use of aspirin: Secondary | ICD-10-CM | POA: Diagnosis not present

## 2014-07-23 DIAGNOSIS — R109 Unspecified abdominal pain: Secondary | ICD-10-CM

## 2014-07-23 LAB — URINALYSIS, ROUTINE W REFLEX MICROSCOPIC
Bilirubin Urine: NEGATIVE
Glucose, UA: NEGATIVE mg/dL
HGB URINE DIPSTICK: NEGATIVE
KETONES UR: NEGATIVE mg/dL
Nitrite: NEGATIVE
PH: 6.5 (ref 5.0–8.0)
PROTEIN: NEGATIVE mg/dL
Specific Gravity, Urine: 1.024 (ref 1.005–1.030)
Urobilinogen, UA: 1 mg/dL (ref 0.0–1.0)

## 2014-07-23 LAB — URINE MICROSCOPIC-ADD ON

## 2014-07-23 MED ORDER — FENTANYL CITRATE 0.05 MG/ML IJ SOLN
100.0000 ug | Freq: Once | INTRAMUSCULAR | Status: AC
  Administered 2014-07-23: 100 ug via INTRAVENOUS
  Filled 2014-07-23: qty 2

## 2014-07-23 NOTE — ED Notes (Signed)
Pt c/o back pain and decreased ability to void has had kidney stiones in the past

## 2014-07-23 NOTE — Discharge Instructions (Signed)
Flank Pain °Flank pain refers to pain that is located on the side of the body between the upper abdomen and the back. The pain may occur over a short period of time (acute) or may be long-term or reoccurring (chronic). It may be mild or severe. Flank pain can be caused by many things. °CAUSES  °Some of the more common causes of flank pain include: °· Muscle strains.   °· Muscle spasms.   °· A disease of your spine (vertebral disk disease).   °· A lung infection (pneumonia).   °· Fluid around your lungs (pulmonary edema).   °· A kidney infection.   °· Kidney stones.   °· A very painful skin rash caused by the chickenpox virus (shingles).   °· Gallbladder disease.   °HOME CARE INSTRUCTIONS  °Home care will depend on the cause of your pain. In general, °· Rest as directed by your caregiver. °· Drink enough fluids to keep your urine clear or pale yellow. °· Only take over-the-counter or prescription medicines as directed by your caregiver. Some medicines may help relieve the pain. °· Tell your caregiver about any changes in your pain. °· Follow up with your caregiver as directed. °SEEK IMMEDIATE MEDICAL CARE IF:  °· Your pain is not controlled with medicine.   °· You have new or worsening symptoms. °· Your pain increases.   °· You have abdominal pain.   °· You have shortness of breath.   °· You have persistent nausea or vomiting.   °· You have swelling in your abdomen.   °· You feel faint or pass out.   °· You have blood in your urine. °· You have a fever or persistent symptoms for more than 2-3 days. °· You have a fever and your symptoms suddenly get worse. °MAKE SURE YOU:  °· Understand these instructions. °· Will watch your condition. °· Will get help right away if you are not doing well or get worse. °Document Released: 10/30/2005 Document Revised: 06/02/2012 Document Reviewed: 04/22/2012 °[employer]® Patient Information ©2015 [employer]. This information is not intended to replace advice given to you by your  health care provider. Make sure you discuss any questions you have with your health care provider. ° °

## 2014-07-23 NOTE — ED Provider Notes (Addendum)
CSN: [id number]     Arrival date & time 07/23/14  2878 History   First MD Initiated Contact with Patient 07/23/14 0430     Chief Complaint  Patient presents with  . Flank Pain     (Consider location/radiation/quality/duration/timing/severity/associated sxs/prior Treatment) HPI  This is a 80 year old male with history of kidney stones. He is here with a four-day history of pain in his right flank. The pain came on gradually and is worsening. He rates it an 8 out of 10 at the present time. It is not been adequately treated with hydrocodone. He has not had hematuria but noticed some difficulty voiding this morning. He denies fever, chills, nausea and vomiting. Pain is somewhat changed with movement or position.  Past Medical History  Diagnosis Date  . Hypertension   . Bursitis   . Arthritis    Past Surgical History  Procedure Laterality Date  . Eye surgery    . Cholecystectomy     History reviewed. No pertinent family history. History  Substance Use Topics  . Smoking status: Former Research scientist (life sciences)  . Smokeless tobacco: Not on file  . Alcohol Use: No    Review of Systems  All other systems reviewed and are negative.   Allergies  Ciprofloxacin; Cyclobenzaprine; and Levaquin  Home Medications   Prior to Admission medications   Medication Sig Start Date End Date Taking? Authorizing Provider  aspirin 81 MG tablet Take 81 mg by mouth daily.    Historical Provider, MD  diltiazem (DILACOR XR) 240 MG 24 hr capsule Take 240 mg by mouth daily.    Historical Provider, MD  hydrochlorothiazide (HYDRODIURIL) 25 MG tablet Take 12.5 mg by mouth daily.    Historical Provider, MD  losartan (COZAAR) 100 MG tablet Take 100 mg by mouth daily.    Historical Provider, MD  Multiple Vitamins-Minerals (ICAPS LUTEIN & OMEGA-3) CAPS Take by mouth.    Historical Provider, MD  polycarbophil (FIBERCON) 625 MG tablet Take 625 mg by mouth daily.    Historical Provider, MD  pravastatin (PRAVACHOL) 40 MG tablet  Take 40 mg by mouth daily.    Historical Provider, MD   BP 159/69 mmHg  Pulse 84  Temp(Src) 97.7 F (36.5 C) (Oral)  Resp 23  Ht 5\' 9"  (1.753 m)  Wt 200 lb (90.719 kg)  BMI 29.52 kg/m2  SpO2 98%   Physical Exam  General: Well-developed, well-nourished male in no acute distress; appearance consistent with age of record HENT: normocephalic; atraumatic Eyes: pupils equal, round and reactive to light; extraocular muscles intact; arcus senilis bilaterally Neck: supple Heart: regular rate and rhythm Lungs: clear to auscultation bilaterally Abdomen: soft; nondistended; mild lower abdominal tenderness; no masses or hepatosplenomegaly; bowel sounds present GU: Mild left CVA tenderness Extremities: No deformity; full range of motion; pulses normal Neurologic: Awake, alert and oriented; motor function intact in all extremities and symmetric; no facial droop Skin: Warm and dry Psychiatric: Normal mood and affect    ED Course  Procedures (including critical care time)    MDM  Nursing notes and vitals signs, including pulse oximetry, reviewed.  Summary of this visit's results, reviewed by myself:  Labs:  Results for orders placed or performed during the hospital encounter of 07/23/14 (from the past 24 hour(s))  Urinalysis, Routine w reflex microscopic     Status: Abnormal   Collection Time: 07/23/14  4:25 AM  Result Value Ref Range   Color, Urine YELLOW YELLOW   APPearance CLEAR CLEAR   Specific Gravity, Urine 1.024  1.005 - 1.030   pH 6.5 5.0 - 8.0   Glucose, UA NEGATIVE NEGATIVE mg/dL   Hgb urine dipstick NEGATIVE NEGATIVE   Bilirubin Urine NEGATIVE NEGATIVE   Ketones, ur NEGATIVE NEGATIVE mg/dL   Protein, ur NEGATIVE NEGATIVE mg/dL   Urobilinogen, UA 1.0 0.0 - 1.0 mg/dL   Nitrite NEGATIVE NEGATIVE   Leukocytes, UA TRACE (A) NEGATIVE  Urine microscopic-add on     Status: None   Collection Time: 07/23/14  4:25 AM  Result Value Ref Range   Squamous Epithelial / LPF RARE  RARE   WBC, UA 0-2 <3 WBC/hpf   RBC / HPF 0-2 <3 RBC/hpf    Imaging Studies: Ct Renal Stone Study  07/23/2014   CLINICAL DATA:  Left flank pain.  EXAM: CT ABDOMEN AND PELVIS WITHOUT CONTRAST  TECHNIQUE: Multidetector CT imaging of the abdomen and pelvis was performed following the standard protocol without IV contrast.  COMPARISON:  CT scans dated 12/31/2011 and 11/09/2010  FINDINGS: Gallbladder is been removed. Biliary tree is in the otherwise normal. Liver, spleen, pancreas, and adrenal glands are normal. There small bilateral renal calculi with a focal area of cortical scarring in the lower pole the right kidney. There is no hydronephrosis. No ureteral or bladder calculi. No perinephric soft tissue stranding.  There arm multiple diverticula throughout the colon, particularly in the descending and sigmoid portions but there is no diverticulitis. Terminal ileum and appendix are normal. No adenopathy or mass lesions.  No acute osseous abnormality. Multilevel degenerative disc disease in the lumbar spine.  IMPRESSION: No acute abnormalities. Bilateral renal calculi. Extensive diverticulosis of the colon.   Electronically Signed   By: Rozetta Nunnery M.D.   On: 07/23/2014 05:58   6:10 AM Patient feeling better after IV femoral. He was advised to CT and urinalysis findings. 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 Pain is likely due to radiculopathy.he states he has an adequate supply of hydrocodone at home.  Wynetta Fines, MD 07/23/14 0940  Wynetta Fines, MD 07/23/14 [phone]

## 2014-12-03 ENCOUNTER — Encounter (HOSPITAL_BASED_OUTPATIENT_CLINIC_OR_DEPARTMENT_OTHER): Payer: Self-pay | Admitting: Emergency Medicine

## 2014-12-03 DIAGNOSIS — Z7982 Long term (current) use of aspirin: Secondary | ICD-10-CM | POA: Insufficient documentation

## 2014-12-03 DIAGNOSIS — M1990 Unspecified osteoarthritis, unspecified site: Secondary | ICD-10-CM | POA: Diagnosis not present

## 2014-12-03 DIAGNOSIS — Z87891 Personal history of nicotine dependence: Secondary | ICD-10-CM | POA: Diagnosis not present

## 2014-12-03 DIAGNOSIS — Z79899 Other long term (current) drug therapy: Secondary | ICD-10-CM | POA: Insufficient documentation

## 2014-12-03 DIAGNOSIS — N23 Unspecified renal colic: Secondary | ICD-10-CM | POA: Diagnosis not present

## 2014-12-03 DIAGNOSIS — I10 Essential (primary) hypertension: Secondary | ICD-10-CM | POA: Insufficient documentation

## 2014-12-03 DIAGNOSIS — R109 Unspecified abdominal pain: Secondary | ICD-10-CM | POA: Diagnosis present

## 2014-12-03 NOTE — ED Notes (Signed)
Pt c/o left flank pain onset x 1 hr Hx kidney stones

## 2014-12-04 ENCOUNTER — Emergency Department (HOSPITAL_BASED_OUTPATIENT_CLINIC_OR_DEPARTMENT_OTHER)
Admission: EM | Admit: 2014-12-04 | Discharge: 2014-12-04 | Disposition: A | Discharge status: Home / Self Care | Payer: Medicare Other | Attending: Emergency Medicine | Admitting: Emergency Medicine

## 2014-12-04 DIAGNOSIS — N23 Unspecified renal colic: Secondary | ICD-10-CM

## 2014-12-04 LAB — URINALYSIS, ROUTINE W REFLEX MICROSCOPIC
BILIRUBIN URINE: NEGATIVE
GLUCOSE, UA: NEGATIVE mg/dL
KETONES UR: 15 mg/dL — AB
NITRITE: NEGATIVE
PH: 6 (ref 5.0–8.0)
PROTEIN: 30 mg/dL — AB
SPECIFIC GRAVITY, URINE: 1.021 (ref 1.005–1.030)
Urobilinogen, UA: 1 mg/dL (ref 0.0–1.0)

## 2014-12-04 LAB — URINE MICROSCOPIC-ADD ON

## 2014-12-04 MED ORDER — SODIUM CHLORIDE 0.9 % IV SOLN: INTRAVENOUS | Status: DC

## 2014-12-04 MED ORDER — TAMSULOSIN HCL 0.4 MG PO CAPS
0.4000 mg | ORAL_CAPSULE | Freq: Once | ORAL | Status: AC
  Administered 2014-12-04: 0.4 mg via ORAL
  Filled 2014-12-04: qty 1

## 2014-12-04 MED ORDER — TAMSULOSIN HCL 0.4 MG PO CAPS: ORAL_CAPSULE | ORAL | Status: AC

## 2014-12-04 NOTE — Discharge Instructions (Signed)

## 2014-12-04 NOTE — ED Provider Notes (Signed)
CSN: [id number]     Arrival date & time 12/03/14  2322 History  This chart was scribed for Shanon Rosser, MD by Peyton Bottoms, ED Scribe. This patient was seen in room MH10/MH10 and the patient's care was started at 1:01 AM.   Chief Complaint  Patient presents with  . Flank Pain   Patient is a 81 y.o. male presenting with flank pain. The history is provided by the patient. No language interpreter was used.  Flank Pain   HPI Comments: Timothy Maynard is a 81 y.o. male with a history of kidney stones who presents to the Emergency Department complaining of left flank pain that began 3 hours ago. He rates his maximum pain at 8/10 and currently rates his pain at 1.5/10. He denies radiation of pain. Patient reports associated dysuria and hematuria. Pain is characterized as like that of previous ureteral colic. He denies associated nausea, vomiting or fevers.   Past Medical History  Diagnosis Date  . Hypertension   . Bursitis   . Arthritis    Past Surgical History  Procedure Laterality Date  . Eye surgery    . Cholecystectomy     History reviewed. No pertinent family history. History  Substance Use Topics  . Smoking status: Former Research scientist (life sciences)  . Smokeless tobacco: Not on file  . Alcohol Use: No   Review of Systems  Genitourinary: Positive for flank pain.   A complete 10 system review of systems was obtained and all systems are negative except as noted in the HPI and PMH.    Allergies  Ciprofloxacin; Cyclobenzaprine; and Levaquin  Home Medications   Prior to Admission medications   Medication Sig Start Date End Date Taking? Authorizing Provider  aspirin 81 MG tablet Take 81 mg by mouth daily.    Historical Provider, MD  diltiazem (DILACOR XR) 240 MG 24 hr capsule Take 240 mg by mouth daily.    Historical Provider, MD  hydrochlorothiazide (HYDRODIURIL) 25 MG tablet Take 12.5 mg by mouth daily.    Historical Provider, MD  losartan (COZAAR) 100 MG tablet Take 100 mg by mouth daily.     Historical Provider, MD  Multiple Vitamins-Minerals (ICAPS LUTEIN & OMEGA-3) CAPS Take by mouth.    Historical Provider, MD  polycarbophil (FIBERCON) 625 MG tablet Take 625 mg by mouth daily.    Historical Provider, MD  pravastatin (PRAVACHOL) 40 MG tablet Take 40 mg by mouth daily.    Historical Provider, MD   Triage Vitals: BP 160/81 mmHg  Pulse 70  Temp(Src) 97.8 F (36.6 C) (Oral)  Resp 18  SpO2 97%  Physical Exam General: Well-developed, well-nourished male in no acute distress; appearance consistent with age of record HENT: normocephalic; atraumatic Eyes: pupils equal, round and reactive to light; extraocular muscles intact; arcus senilis bilaterally Neck: supple Heart: regular rate and rhythm; no murmurs, rubs or gallops Lungs: clear to auscultation bilaterally Abdomen: soft; nondistended; nontender; no masses or hepatosplenomegaly; bowel sounds present Extremities: No deformity; full range of motion; pulses normal; trace edema of lower legs Neurologic: Awake, alert and oriented; motor function intact in all extremities and symmetric; no facial droop Skin: Warm and dry Psychiatric: Normal mood and affect  ED Course  Procedures (including critical care time)  DIAGNOSTIC STUDIES: Oxygen Saturation is 97% on RA, normal by my interpretation.    COORDINATION OF CARE: 1:07 AM- Discussed plans to order diagnostic urinalysis. Will give patient IV fluids. Pt advised of plan for treatment and pt agrees.  MDM   Nursing  notes and vitals signs, including pulse oximetry, reviewed.  Summary of this visit's results, reviewed by myself:  Labs:  Results for orders placed or performed during the hospital encounter of 12/04/14 (from the past 24 hour(s))  Urinalysis, Routine w reflex microscopic     Status: Abnormal   Collection Time: 12/04/14  1:06 AM  Result Value Ref Range   Color, Urine YELLOW YELLOW   APPearance CLEAR CLEAR   Specific Gravity, Urine 1.021 1.005 - 1.030   pH 6.0  5.0 - 8.0   Glucose, UA NEGATIVE NEGATIVE mg/dL   Hgb urine dipstick LARGE (A) NEGATIVE   Bilirubin Urine NEGATIVE NEGATIVE   Ketones, ur 15 (A) NEGATIVE mg/dL   Protein, ur 30 (A) NEGATIVE mg/dL   Urobilinogen, UA 1.0 0.0 - 1.0 mg/dL   Nitrite NEGATIVE NEGATIVE   Leukocytes, UA SMALL (A) NEGATIVE  Urine microscopic-add on     Status: Abnormal   Collection Time: 12/04/14  1:06 AM  Result Value Ref Range   Squamous Epithelial / LPF RARE RARE   WBC, UA 3-6 <3 WBC/hpf   RBC / HPF TOO NUMEROUS TO COUNT <3 RBC/hpf   Bacteria, UA RARE RARE   Crystals CA OXALATE CRYSTALS (A) NEGATIVE   Urine-Other MUCOUS PRESENT    1:57 AM Patient continues to be comfortable and has not required medications in the ED. He states he has adequate pain medication at home.   Shanon Rosser, MD 12/04/14 [phone]

## 2014-12-04 NOTE — ED Notes (Signed)
Patient reports pain on the left side which began about 3 hours ago.  Reports he has previously had kidney stones.  Reports dysuria, denies hematuria. Denies nausea, vomiting.  Denies fevers.

## 2014-12-04 NOTE — ED Notes (Signed)
Patient reports pain is 1.5.  Okay to hold on IV per provider.

## 2015-06-17 IMAGING — CR DG WRIST COMPLETE 3+V*L*
4 series · 4 of 4 positions shown · non-contrast
Comparison: None.

CLINICAL DATA: Left wrist injury playing racquetball.  Increased
pain with movement.

LEFT WRIST - COMPLETE 3+ VIEW

[x wrist pa left]
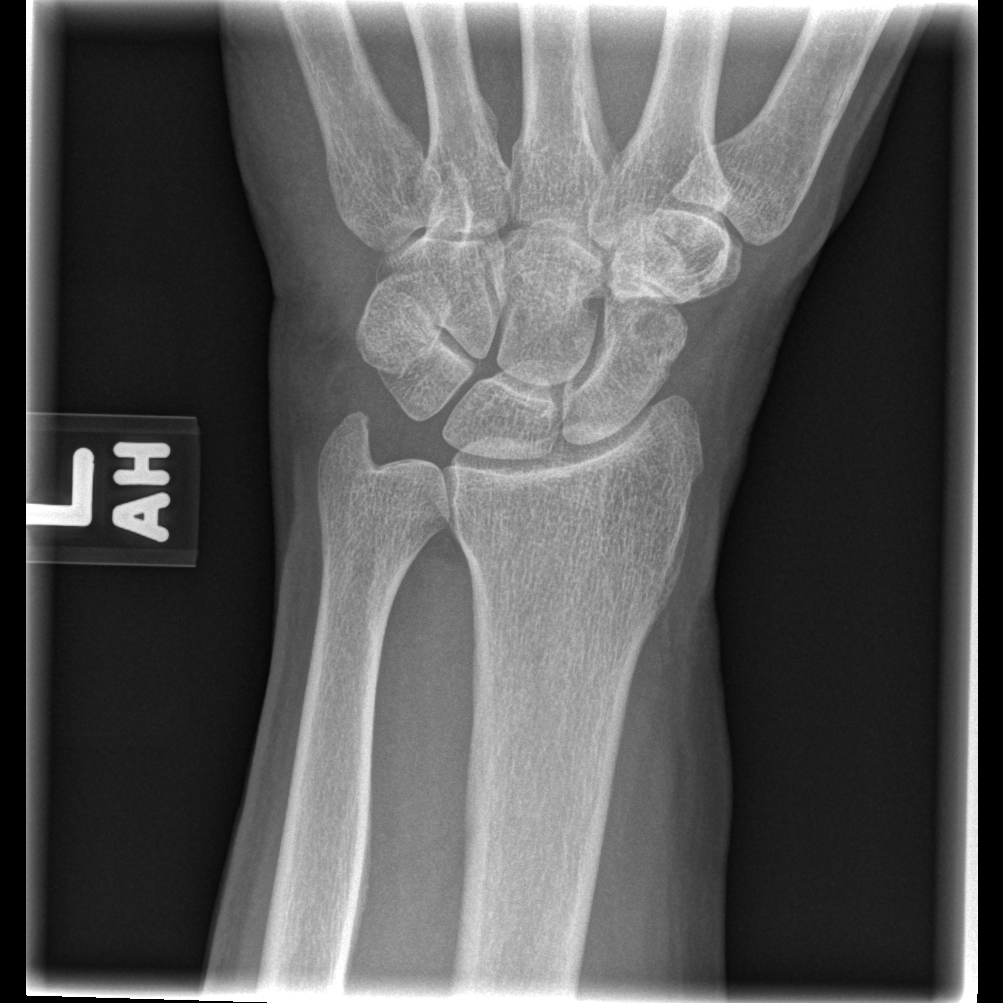

[x wrist obl left]
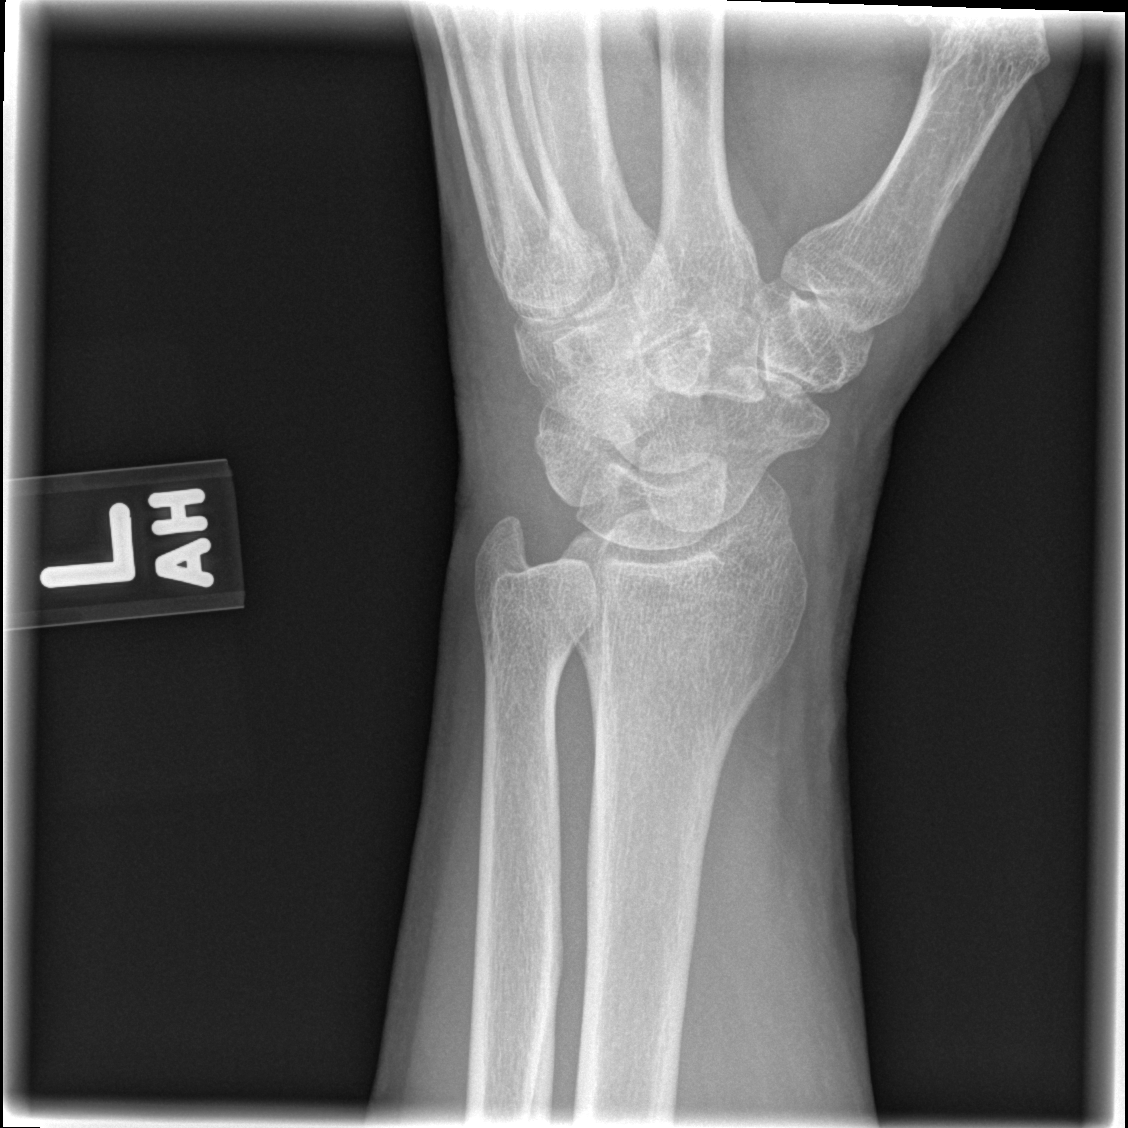

[x wrist lat left]
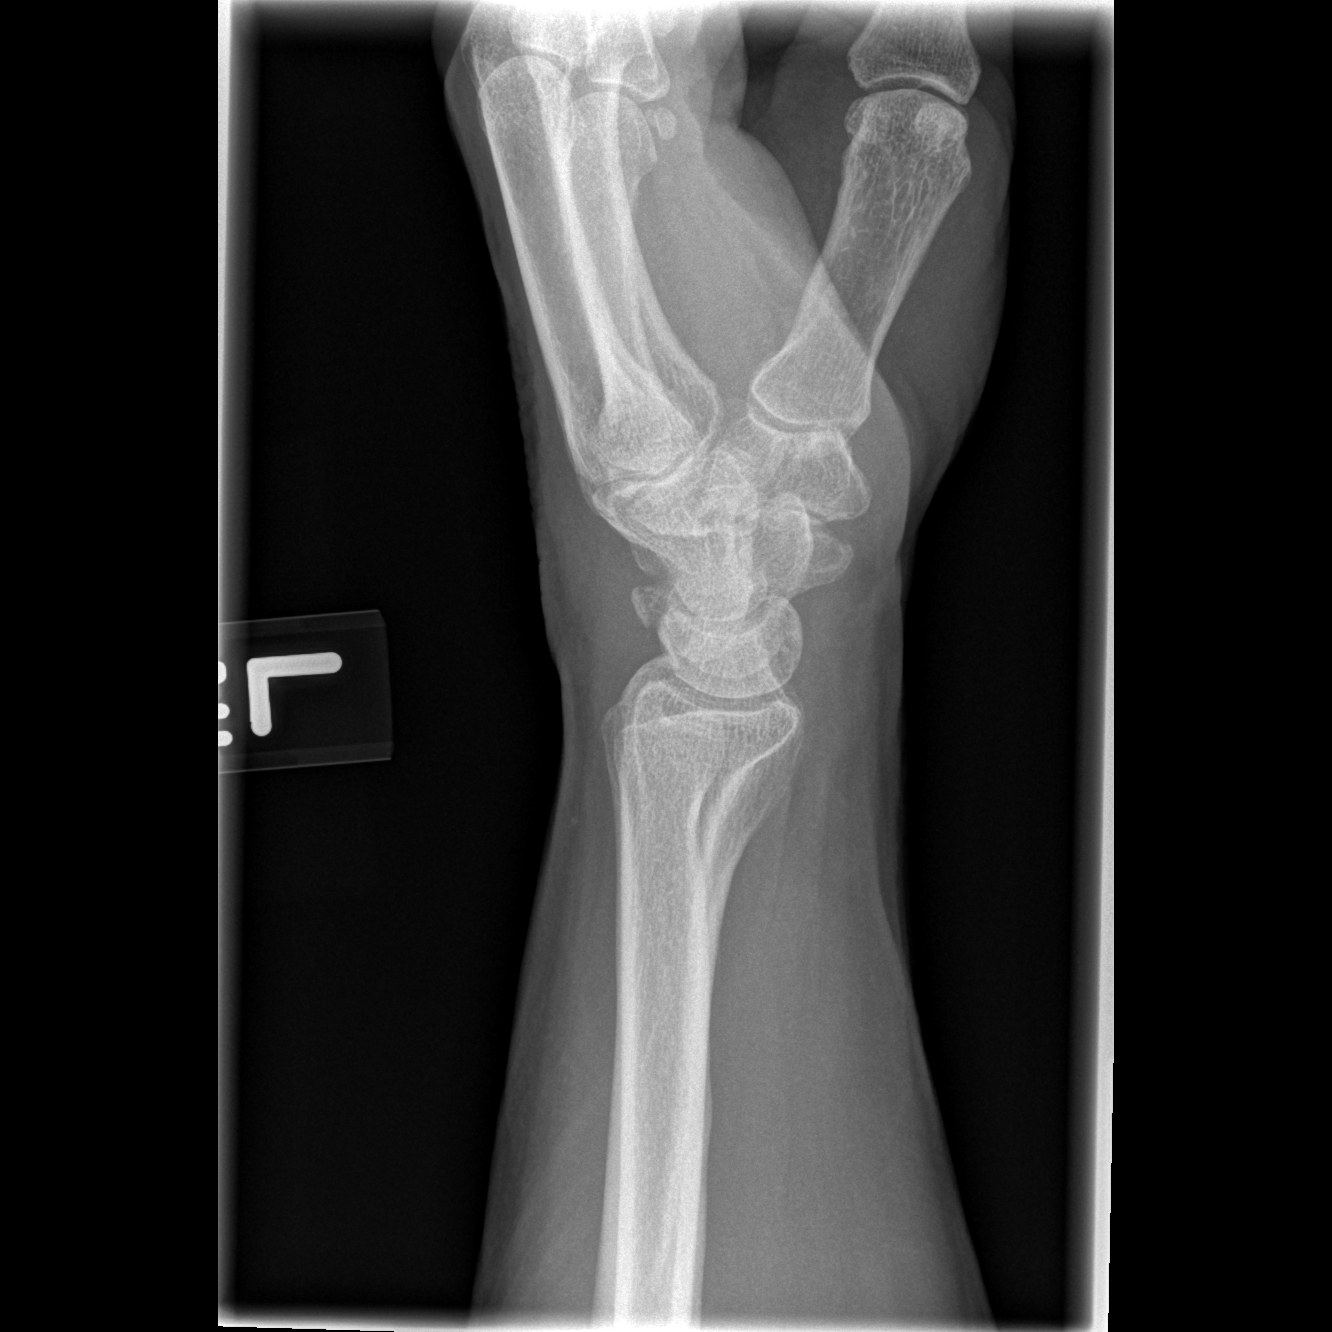

[x navicular]
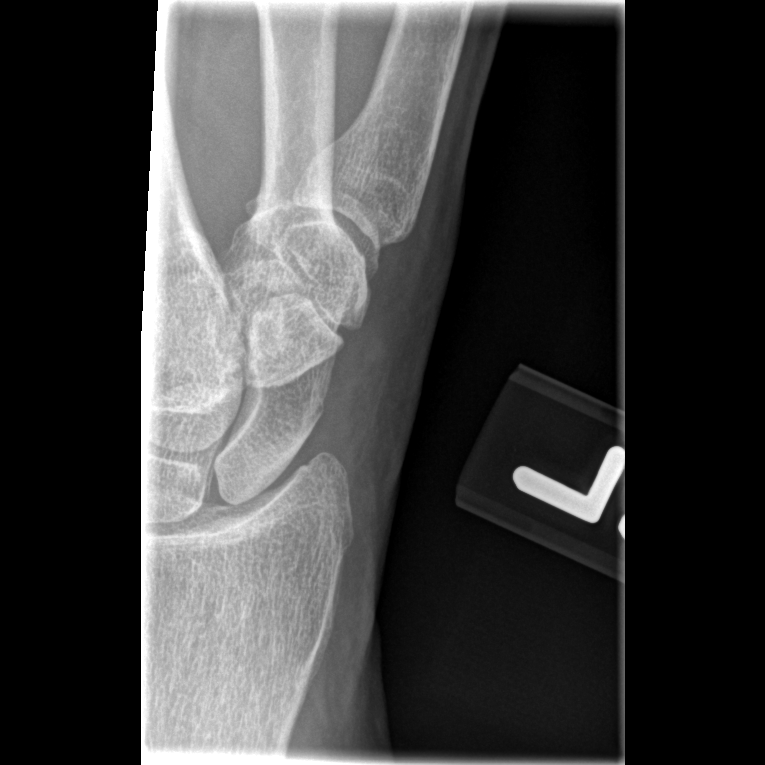

[4 of 4 positions shown; findings below may reference images not displayed]

FINDINGS: Degenerative changes in the STT joints.  Circumscribed
lucent structure in the distal scaphoid likely represents a
degenerative cyst or benign bone cyst.  Focal cortical cleft
demonstrated over the dorsal aspect of the triquetral bone with
overlying soft tissue swelling.  Nondisplaced triquetral fracture
is not excluded.  Correlation with site of patient's pain is
recommended.  Carpal bones appear otherwise intact. No radiopaque
soft tissue foreign bodies.
IMPRESSION: Focal cleft in the dorsal aspect the triquetral bone with overlying
soft tissue swelling could represent nondisplaced fracture.
Degenerative changes in the STT joints with probable degenerative
cyst in the scaphoid.

## 2017-09-28 DIAGNOSIS — H04123 Dry eye syndrome of bilateral lacrimal glands: Secondary | ICD-10-CM | POA: Diagnosis not present

## 2017-09-28 DIAGNOSIS — H353122 Nonexudative age-related macular degeneration, left eye, intermediate dry stage: Secondary | ICD-10-CM | POA: Diagnosis not present

## 2017-09-28 DIAGNOSIS — D3131 Benign neoplasm of right choroid: Secondary | ICD-10-CM | POA: Diagnosis not present

## 2017-09-28 DIAGNOSIS — H401132 Primary open-angle glaucoma, bilateral, moderate stage: Secondary | ICD-10-CM | POA: Diagnosis not present

## 2017-09-28 DIAGNOSIS — H353113 Nonexudative age-related macular degeneration, right eye, advanced atrophic without subfoveal involvement: Secondary | ICD-10-CM | POA: Diagnosis not present

## 2017-09-30 DIAGNOSIS — H353122 Nonexudative age-related macular degeneration, left eye, intermediate dry stage: Secondary | ICD-10-CM | POA: Diagnosis not present

## 2017-09-30 DIAGNOSIS — H353114 Nonexudative age-related macular degeneration, right eye, advanced atrophic with subfoveal involvement: Secondary | ICD-10-CM | POA: Diagnosis not present

## 2017-09-30 DIAGNOSIS — H43812 Vitreous degeneration, left eye: Secondary | ICD-10-CM | POA: Diagnosis not present

## 2018-01-25 DIAGNOSIS — H903 Sensorineural hearing loss, bilateral: Secondary | ICD-10-CM | POA: Diagnosis not present

## 2018-01-27 DIAGNOSIS — H04123 Dry eye syndrome of bilateral lacrimal glands: Secondary | ICD-10-CM | POA: Diagnosis not present

## 2018-01-27 DIAGNOSIS — H01001 Unspecified blepharitis right upper eyelid: Secondary | ICD-10-CM | POA: Diagnosis not present

## 2018-02-08 DIAGNOSIS — H04123 Dry eye syndrome of bilateral lacrimal glands: Secondary | ICD-10-CM | POA: Diagnosis not present

## 2018-02-08 DIAGNOSIS — H01001 Unspecified blepharitis right upper eyelid: Secondary | ICD-10-CM | POA: Diagnosis not present

## 2018-02-08 DIAGNOSIS — H01002 Unspecified blepharitis right lower eyelid: Secondary | ICD-10-CM | POA: Diagnosis not present

## 2018-04-19 DIAGNOSIS — H04123 Dry eye syndrome of bilateral lacrimal glands: Secondary | ICD-10-CM | POA: Diagnosis not present

## 2018-04-19 DIAGNOSIS — H401132 Primary open-angle glaucoma, bilateral, moderate stage: Secondary | ICD-10-CM | POA: Diagnosis not present

## 2018-04-19 DIAGNOSIS — H01002 Unspecified blepharitis right lower eyelid: Secondary | ICD-10-CM | POA: Diagnosis not present

## 2018-04-19 DIAGNOSIS — H01001 Unspecified blepharitis right upper eyelid: Secondary | ICD-10-CM | POA: Diagnosis not present

## 2018-09-08 DIAGNOSIS — H01001 Unspecified blepharitis right upper eyelid: Secondary | ICD-10-CM | POA: Diagnosis not present

## 2018-09-08 DIAGNOSIS — H401132 Primary open-angle glaucoma, bilateral, moderate stage: Secondary | ICD-10-CM | POA: Diagnosis not present

## 2018-09-08 DIAGNOSIS — H04123 Dry eye syndrome of bilateral lacrimal glands: Secondary | ICD-10-CM | POA: Diagnosis not present

## 2018-09-08 DIAGNOSIS — H353113 Nonexudative age-related macular degeneration, right eye, advanced atrophic without subfoveal involvement: Secondary | ICD-10-CM | POA: Diagnosis not present

## 2019-07-15 DIAGNOSIS — H43812 Vitreous degeneration, left eye: Secondary | ICD-10-CM | POA: Diagnosis not present

## 2019-07-15 DIAGNOSIS — H353122 Nonexudative age-related macular degeneration, left eye, intermediate dry stage: Secondary | ICD-10-CM | POA: Diagnosis not present

## 2019-07-15 DIAGNOSIS — H04123 Dry eye syndrome of bilateral lacrimal glands: Secondary | ICD-10-CM | POA: Diagnosis not present

## 2019-07-15 DIAGNOSIS — H353114 Nonexudative age-related macular degeneration, right eye, advanced atrophic with subfoveal involvement: Secondary | ICD-10-CM | POA: Diagnosis not present

## 2024-02-21 DEATH — deceased
